# Patient Record
Sex: Male | Born: 1985 | Race: White | Hispanic: No | Marital: Single | Smoking: Current every day smoker
Health system: Southern US, Community
[De-identification: ages and names within clinical notes are randomized; demographics above are authoritative.]

## PROBLEM LIST (undated history)

## (undated) HISTORY — PX: KNEE SURGERY: SHX244

## (undated) HISTORY — PX: HAND SURGERY: SHX662

## (undated) HISTORY — PX: CYST EXCISION: SHX5701

## (undated) HISTORY — PX: WISDOM TOOTH EXTRACTION: SHX21

## (undated) HISTORY — PX: SEPTOPLASTY: SUR1290

---
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
## 2009-05-19 ENCOUNTER — Ambulatory Visit (HOSPITAL_BASED_OUTPATIENT_CLINIC_OR_DEPARTMENT_OTHER): Admission: RE | Admit: 2009-05-19 | Discharge: 2009-05-19 | Payer: Self-pay | Admitting: Orthopedic Surgery

## 2009-09-29 ENCOUNTER — Ambulatory Visit (HOSPITAL_COMMUNITY): Admission: RE | Admit: 2009-09-29 | Discharge: 2009-09-29 | Payer: Self-pay | Admitting: Otolaryngology

## 2010-03-06 ENCOUNTER — Encounter: Payer: Self-pay | Admitting: Orthopedic Surgery

## 2010-04-30 LAB — CBC
HCT: 47 % (ref 39.0–52.0)
Hemoglobin: 16.3 g/dL (ref 13.0–17.0)
MCH: 29.6 pg (ref 26.0–34.0)
MCHC: 34.7 g/dL (ref 30.0–36.0)
MCV: 85.3 fL (ref 78.0–100.0)
Platelets: 210 10*3/uL (ref 150–400)
RBC: 5.51 MIL/uL (ref 4.22–5.81)
RDW: 12 % (ref 11.5–15.5)
WBC: 7.6 10*3/uL (ref 4.0–10.5)

## 2010-04-30 LAB — URINALYSIS, ROUTINE W REFLEX MICROSCOPIC
Bilirubin Urine: NEGATIVE
Glucose, UA: NEGATIVE mg/dL
Ketones, ur: NEGATIVE mg/dL
Leukocytes, UA: NEGATIVE
Nitrite: NEGATIVE
Protein, ur: NEGATIVE mg/dL
Specific Gravity, Urine: 1.026 (ref 1.005–1.030)
Urobilinogen, UA: 1 mg/dL (ref 0.0–1.0)
pH: 6 (ref 5.0–8.0)

## 2010-04-30 LAB — SURGICAL PCR SCREEN
MRSA, PCR: NEGATIVE
Staphylococcus aureus: NEGATIVE

## 2010-04-30 LAB — URINE MICROSCOPIC-ADD ON

## 2010-05-05 LAB — POCT HEMOGLOBIN-HEMACUE: Hemoglobin: 15.8 g/dL (ref 13.0–17.0)

## 2012-09-29 ENCOUNTER — Emergency Department (HOSPITAL_COMMUNITY)
Admission: EM | Admit: 2012-09-29 | Discharge: 2012-09-29 | Disposition: A | Discharge status: Home / Self Care | Payer: BC Managed Care – PPO | Attending: Emergency Medicine | Admitting: Emergency Medicine

## 2012-09-29 ENCOUNTER — Encounter (HOSPITAL_COMMUNITY): Payer: Self-pay | Admitting: *Deleted

## 2012-09-29 DIAGNOSIS — Y929 Unspecified place or not applicable: Secondary | ICD-10-CM | POA: Insufficient documentation

## 2012-09-29 DIAGNOSIS — S339XXA Sprain of unspecified parts of lumbar spine and pelvis, initial encounter: Secondary | ICD-10-CM | POA: Insufficient documentation

## 2012-09-29 DIAGNOSIS — Y9389 Activity, other specified: Secondary | ICD-10-CM | POA: Insufficient documentation

## 2012-09-29 DIAGNOSIS — Z791 Long term (current) use of non-steroidal anti-inflammatories (NSAID): Secondary | ICD-10-CM | POA: Insufficient documentation

## 2012-09-29 DIAGNOSIS — X503XXA Overexertion from repetitive movements, initial encounter: Secondary | ICD-10-CM | POA: Insufficient documentation

## 2012-09-29 DIAGNOSIS — F17200 Nicotine dependence, unspecified, uncomplicated: Secondary | ICD-10-CM | POA: Insufficient documentation

## 2012-09-29 DIAGNOSIS — S39012A Strain of muscle, fascia and tendon of lower back, initial encounter: Secondary | ICD-10-CM

## 2012-09-29 MED ORDER — HYDROCODONE-ACETAMINOPHEN 5-325 MG PO TABS: ORAL_TABLET | ORAL | Status: DC

## 2012-09-29 NOTE — ED Provider Notes (Signed)
CSN: [id number]     Arrival date & time 09/29/12  1322 History     First MD Initiated Contact with Patient 09/29/12 1334     Chief Complaint  Patient presents with  . Back Pain   (Consider location/radiation/quality/duration/timing/severity/associated sxs/prior Treatment) HPI Comments: Patient presents with complaint of back pain. Pain started gradually yesterday after lifting 100 pound objects into his car. Pain gradually worsened during the day. He complains of weakness. Patient went to an outside urgent care and was prescribed diclofenac and Norflex which he has been taking with minimal relief. Pain continued today. Patient denies all red flag signs and symptoms of lower back pain. Course is constant. Movement makes pain worse.  Patient is a 27 y.o. male presenting with back pain. The history is provided by the patient.  Back Pain Associated symptoms: no fever, no numbness and no weakness     History reviewed. No pertinent past medical history. Past Surgical History  Procedure Laterality Date  . Knee surgery     No family history on file. History  Substance Use Topics  . Smoking status: Current Every Day Smoker -- 0.25 packs/day    Types: Cigarettes  . Smokeless tobacco: Not on file  . Alcohol Use: 16.8 oz/week    28 Cans of beer per week    Review of Systems  Constitutional: Negative for fever and unexpected weight change.  Gastrointestinal: Negative for constipation.       Neg for fecal incontinence  Genitourinary: Negative for hematuria, flank pain and difficulty urinating.       Negative for urinary incontinence or retention  Musculoskeletal: Positive for back pain.  Neurological: Negative for weakness and numbness.       Negative for saddle paresthesias     Allergies  Review of patient's allergies indicates no known allergies.  Home Medications   Current Outpatient Rx  Name  Route  Sig  Dispense  Refill  . diclofenac (VOLTAREN) 75 MG EC tablet   Oral  Take 75 mg by mouth 2 (two) times daily.         . orphenadrine (NORFLEX) 100 MG tablet   Oral   Take 100 mg by mouth 2 (two) times daily as needed for muscle spasms or pain.         Marland Kitchen HYDROcodone-acetaminophen (NORCO/VICODIN) 5-325 MG per tablet      Take 1-2 tablets every 6 hours as needed for severe pain   8 tablet   0    BP 143/83  Pulse 70  Temp(Src) 98.2 F (36.8 C) (Oral)  Resp 14  SpO2 99% Physical Exam  Nursing note and vitals reviewed. Constitutional: He appears well-developed and well-nourished.  HENT:  Head: Normocephalic and atraumatic.  Eyes: Conjunctivae are normal.  Neck: Normal range of motion.  Abdominal: Soft. There is no tenderness. There is no CVA tenderness.  Musculoskeletal: Normal range of motion.       Cervical back: He exhibits normal range of motion, no tenderness and no bony tenderness.       Thoracic back: He exhibits normal range of motion, no tenderness and no bony tenderness.       Lumbar back: He exhibits tenderness. He exhibits normal range of motion and no bony tenderness.       Back:  No step-off noted with palpation of spine.   Neurological: He is alert. He has normal reflexes. No sensory deficit. He exhibits normal muscle tone.  5/5 strength in entire lower extremities bilaterally. No sensation  deficit.   Skin: Skin is warm and dry.  Psychiatric: He has a normal mood and affect.    ED Course   Procedures (including critical care time)  Labs Reviewed - No data to display No results found. 1. Lumbosacral strain, initial encounter    2:02 PM Patient seen and examined.  Vital signs reviewed and are as follows: Filed Vitals:   09/29/12 1330  BP: 143/83  Pulse: 70  Temp: 98.2 F (36.8 C)  Resp: 14    No red flag s/s of low back pain. Patient was counseled on back pain precautions and told to do activity as tolerated but do not lift, push, or pull heavy objects more than 10 pounds for the next week.  Patient counseled to  use ice or heat on back for no longer than 15 minutes every hour.   Patient prescribed narcotic pain medicine and counseled on proper use of narcotic pain medications. Counseled not to combine this medication with others containing tylenol.   Urged patient not to drink alcohol, drive, or perform any other activities that requires focus while taking either of these medications.  Patient urged to follow-up with PCP if pain does not improve with treatment and rest or if pain becomes recurrent. Urged to return with worsening severe pain, loss of bowel or bladder control, trouble walking.   The patient verbalizes understanding and agrees with the plan.  MDM  Patient with back pain. No neurological deficits. Patient is ambulatory. No warning symptoms of back pain including: loss of bowel or bladder control, night sweats, waking from sleep with back pain, unexplained fevers or weight loss, h/o cancer, IVDU, recent trauma. No concern for cauda equina, epidural abscess, or other serious cause of back pain. Conservative measures such as rest, ice/heat and pain medicine indicated with PCP follow-up if no improvement with conservative management.    Renne Crigler, PA-C 09/29/12 1407

## 2012-09-29 NOTE — ED Notes (Signed)
PT c/o lower back pain after placing heavy objects in car.  Denies numbness/tingling.  Pt was seen at [facility] yesterday and given pain meds, but pain has increased and pt states increased pain when bearing weight on L foot.

## 2012-09-30 NOTE — ED Provider Notes (Signed)
Medical screening examination/treatment/procedure(s) were performed by non-physician practitioner and as supervising physician I was immediately available for consultation/collaboration.   Rodney Wigger, MD 09/30/12 1224 

## 2014-12-29 ENCOUNTER — Encounter (HOSPITAL_BASED_OUTPATIENT_CLINIC_OR_DEPARTMENT_OTHER): Payer: Self-pay | Admitting: Emergency Medicine

## 2014-12-29 ENCOUNTER — Emergency Department (HOSPITAL_BASED_OUTPATIENT_CLINIC_OR_DEPARTMENT_OTHER): Payer: Worker's Compensation

## 2014-12-29 DIAGNOSIS — Y9289 Other specified places as the place of occurrence of the external cause: Secondary | ICD-10-CM | POA: Diagnosis not present

## 2014-12-29 DIAGNOSIS — W208XXA Other cause of strike by thrown, projected or falling object, initial encounter: Secondary | ICD-10-CM | POA: Insufficient documentation

## 2014-12-29 DIAGNOSIS — Z9889 Other specified postprocedural states: Secondary | ICD-10-CM | POA: Diagnosis not present

## 2014-12-29 DIAGNOSIS — Y998 Other external cause status: Secondary | ICD-10-CM | POA: Diagnosis not present

## 2014-12-29 DIAGNOSIS — F17210 Nicotine dependence, cigarettes, uncomplicated: Secondary | ICD-10-CM | POA: Diagnosis not present

## 2014-12-29 DIAGNOSIS — Z791 Long term (current) use of non-steroidal anti-inflammatories (NSAID): Secondary | ICD-10-CM | POA: Insufficient documentation

## 2014-12-29 DIAGNOSIS — Y9389 Activity, other specified: Secondary | ICD-10-CM | POA: Insufficient documentation

## 2014-12-29 DIAGNOSIS — S8991XA Unspecified injury of right lower leg, initial encounter: Secondary | ICD-10-CM | POA: Diagnosis present

## 2014-12-29 NOTE — ED Notes (Signed)
Right knee pain after dropping a piece of metal on it. The patient reports that it has worsened through the day

## 2014-12-30 ENCOUNTER — Emergency Department (HOSPITAL_BASED_OUTPATIENT_CLINIC_OR_DEPARTMENT_OTHER)
Admission: EM | Admit: 2014-12-30 | Discharge: 2014-12-30 | Disposition: A | Discharge status: Home / Self Care | Payer: Worker's Compensation | Attending: Emergency Medicine | Admitting: Emergency Medicine

## 2014-12-30 DIAGNOSIS — S8991XA Unspecified injury of right lower leg, initial encounter: Secondary | ICD-10-CM

## 2014-12-30 NOTE — Discharge Instructions (Signed)
Keep leg elevated to help control swelling. Call and make an appointment to follow-up with the orthopedist. Return immediately for worsening pain or for any concerns.    Knee Immobilizer A knee immobilizer is used to support and protect an injured or painful knee. Knee immobilizers keep your knee from being used while it is healing. Some of the common immobilizers used include splints (air, plaster, fiberglass, stiff cloth, or aluminum) or casts. Wear your knee immobilizer as instructed and only remove it as instructed. HOME CARE INSTRUCTIONS   Use absorbent powder (such as baby powder or talcum powder) to control irritation from sweat and friction.  Adjust the immobilizer to be firm but not tight. Signs of an immobilizer that is too tight include:  Swelling.  Numbness.  Color change in your foot or ankle.  Increased pain.  While resting, raise your leg above the level of your heart. Pillows can be used for support. This reduces throbbing and helps healing.  Remove the immobilizer to bathe and sleep. SEEK MEDICAL CARE IF:   You have increasing pain or swelling in the knee, foot, or ankle.  You have problems caused by the knee immobilizer, or it breaks or needs replacement. MAKE SURE YOU:   Understand these instructions.  Will watch your condition.  Will get help right away if you are not doing well or get worse.   This information is not intended to replace advice given to you by your health care provider. Make sure you discuss any questions you have with your health care provider.   Document Released: 01/31/2005 Document Revised: 02/21/2014 Document Reviewed: 09/24/2012 Elsevier Interactive Patient Education [employer]2016 [employer].

## 2014-12-30 NOTE — ED Provider Notes (Signed)
CSN: [id number]     Arrival date & time 12/29/14  2228 History  By signing my name below, I, Budd Palmer, attest that this documentation has been prepared under the direction and in the presence of Loren Racer, MD. Electronically Signed: Budd Palmer, ED Scribe. 12/30/2014. 1:04 AM.    Chief Complaint  Patient presents with  . Knee Pain   The history is provided by the patient and a parent. No language interpreter was used.   HPI Comments: Bradley Brady is a 29 y.o. male smoker at 0.25 ppd with a PSHx of left knee surgery who presents to the Emergency Department complaining of constant, aching, worsening right knee pain onset. Pt states he dropped a heavy piece of sheet metal on top of left knee 11 hours ago. He states he did not pay much attention to it and was sitting at a desk for a while. He notes the pain then began to worsen progressively. He reports associated popping and swelling of the joint. Mom states pt has been walking on tiptoe on the right foot as the pain worsened. He notes he has tried taking tylenol with no relief. He states he has also been keeping it elevated and has applied ice once. He reports exacerbation of the pain with walking and straightening the knee. He denies any focal weakness or numbness.   History reviewed. No pertinent past medical history. Past Surgical History  Procedure Laterality Date  . Knee surgery     History reviewed. No pertinent family history. Social History  Substance Use Topics  . Smoking status: Current Every Day Smoker -- 0.25 packs/day    Types: Cigarettes  . Smokeless tobacco: None  . Alcohol Use: 16.8 oz/week    28 Cans of beer per week    Review of Systems  Musculoskeletal: Positive for joint swelling, arthralgias and gait problem.  Neurological: Negative for weakness and numbness.  All other systems reviewed and are negative.   Allergies  Review of patient's allergies indicates no known allergies.  Home  Medications   Prior to Admission medications   Medication Sig Start Date End Date Taking? Authorizing Provider  diclofenac (VOLTAREN) 75 MG EC tablet Take 75 mg by mouth 2 (two) times daily.    Historical Provider, MD  HYDROcodone-acetaminophen (NORCO/VICODIN) 5-325 MG per tablet Take 1-2 tablets every 6 hours as needed for severe pain 09/29/12   Renne Crigler, PA-C  orphenadrine (NORFLEX) 100 MG tablet Take 100 mg by mouth 2 (two) times daily as needed for muscle spasms or pain.    Historical Provider, MD   BP 137/74 mmHg  Pulse 87  Temp(Src) 99.1 F (37.3 C) (Oral)  Resp 16  Ht  (1.676 m)  Wt 180 lb (81.647 kg)  BMI 29.07 kg/m2  SpO2 100% Physical Exam  Constitutional: He is oriented to person, place, and time. He appears well-developed and well-nourished. No distress.  HENT:  Head: Normocephalic and atraumatic.  Mouth/Throat: Oropharynx is clear and moist.  Eyes: EOM are normal. Pupils are equal, round, and reactive to light.  Neck: Normal range of motion. Neck supple.  Cardiovascular: Normal rate and regular rhythm.   Pulmonary/Chest: Effort normal and breath sounds normal. No respiratory distress. He has no wheezes. He has no rales.  Abdominal: Soft. Bowel sounds are normal.  Musculoskeletal: Normal range of motion. He exhibits tenderness. He exhibits no edema.  Patient with mild tenderness just superior to the right patella. There is a small area of erythema. There is  no patellar tenderness. Small amount of suprapatellar effusion. No ligamentous instability. Full range of motion of the right knee without pain. Distal pulses intact.  Neurological: He is alert and oriented to person, place, and time.  Patient with 5/5 flexion and extension of the right knee. Sensation to the right leg intact. Ambulating without difficulty or obvious pain.  Skin: Skin is warm and dry. No rash noted. No erythema.  Psychiatric: He has a normal mood and affect. His behavior is normal.  Nursing  note and vitals reviewed.   ED Course  Procedures  DIAGNOSTIC STUDIES: Oxygen Saturation is 100% on RA, normal by my interpretation.    COORDINATION OF CARE: 1:02 AM - Discussed XR results. Discussed plans to order a knee immobilizer. Will refer to an orthopedist for re-evaluation. Pt advised of plan for treatment and pt agrees.  Labs Review Labs Reviewed - No data to display  Imaging Review Dg Knee Complete 4 Views Right  12/29/2014  CLINICAL DATA:  Trauma to the anterior knee 11 hours ago, struck by a piece of metal. Worsening pain. EXAM: RIGHT KNEE - COMPLETE 4+ VIEW COMPARISON:  None. FINDINGS: There is in knee joint effusion. Question of osteochondral fracture of the femoral condylar region. These findings can be seen with ACL tear. Was the mechanism of injury consistent with that? No other definite fracture. IMPRESSION: Joint effusion. Question femoral condylar osteochondral fracture, as may be seen in the setting of an ACL tear. Is the mechanism of injury consistent with that? Electronically Signed   By: Paulina FusiMark  Shogry M.D.   On: 12/29/2014 23:22   I have personally reviewed and evaluated these images and lab results as part of my medical decision-making.   EKG Interpretation None      MDM   Final diagnoses:  Knee injury, right, initial encounter    I personally performed the services described in this documentation, which was scribed in my presence. The recorded information has been reviewed and is accurate.   Questionable condylar osteochondral fracture read by radiologist. This does not appear to be consistent with the mechanism of injury. No definite ligamentous instability. Again not consistent with the mechanism of injury. We'll place in a knee immobilizer and have follow-up with orthopedics for repeat imaging and MRI as necessary. Return precautions given.  Loren Raceravid Jermany Rimel, MD 12/30/14 [phone]

## 2014-12-30 NOTE — ED Notes (Signed)
MD at bedside. 

## 2016-05-31 ENCOUNTER — Emergency Department (HOSPITAL_BASED_OUTPATIENT_CLINIC_OR_DEPARTMENT_OTHER)
Admission: EM | Admit: 2016-05-31 | Discharge: 2016-05-31 | Disposition: A | Discharge status: Home / Self Care | Payer: Worker's Compensation | Attending: Emergency Medicine | Admitting: Emergency Medicine

## 2016-05-31 ENCOUNTER — Encounter (HOSPITAL_BASED_OUTPATIENT_CLINIC_OR_DEPARTMENT_OTHER): Payer: Self-pay

## 2016-05-31 DIAGNOSIS — Z87891 Personal history of nicotine dependence: Secondary | ICD-10-CM | POA: Insufficient documentation

## 2016-05-31 DIAGNOSIS — X500XXA Overexertion from strenuous movement or load, initial encounter: Secondary | ICD-10-CM | POA: Diagnosis not present

## 2016-05-31 DIAGNOSIS — S39012A Strain of muscle, fascia and tendon of lower back, initial encounter: Secondary | ICD-10-CM | POA: Diagnosis not present

## 2016-05-31 DIAGNOSIS — Y93F2 Activity, caregiving, lifting: Secondary | ICD-10-CM | POA: Insufficient documentation

## 2016-05-31 DIAGNOSIS — Y990 Civilian activity done for income or pay: Secondary | ICD-10-CM | POA: Insufficient documentation

## 2016-05-31 DIAGNOSIS — Y929 Unspecified place or not applicable: Secondary | ICD-10-CM | POA: Insufficient documentation

## 2016-05-31 DIAGNOSIS — S3992XA Unspecified injury of lower back, initial encounter: Secondary | ICD-10-CM | POA: Diagnosis present

## 2016-05-31 MED ORDER — IBUPROFEN 600 MG PO TABS: 600.0000 mg | ORAL_TABLET | Freq: Three times a day (TID) | ORAL | 0 refills | Status: DC | PRN

## 2016-05-31 MED ORDER — HYDROCODONE-ACETAMINOPHEN 5-325 MG PO TABS: 1.0000 | ORAL_TABLET | Freq: Four times a day (QID) | ORAL | 0 refills | Status: DC | PRN

## 2016-05-31 MED ORDER — CYCLOBENZAPRINE HCL 10 MG PO TABS: 10.0000 mg | ORAL_TABLET | Freq: Two times a day (BID) | ORAL | 0 refills | Status: DC | PRN

## 2016-05-31 MED ORDER — HYDROCODONE-ACETAMINOPHEN 5-325 MG PO TABS
1.0000 | ORAL_TABLET | Freq: Once | ORAL | Status: AC
  Administered 2016-05-31: 1 via ORAL
  Filled 2016-05-31: qty 1

## 2016-05-31 MED ORDER — SODIUM CHLORIDE 0.9 % IV BOLUS (SEPSIS)
250.0000 mL | Freq: Once | INTRAVENOUS | Status: AC
  Administered 2016-05-31: 250 mL via INTRAVENOUS

## 2016-05-31 MED ORDER — KETOROLAC TROMETHAMINE 60 MG/2ML IM SOLN
30.0000 mg | Freq: Once | INTRAMUSCULAR | Status: AC
  Administered 2016-05-31: 30 mg via INTRAMUSCULAR
  Filled 2016-05-31: qty 2

## 2016-05-31 MED ORDER — ONDANSETRON HCL 4 MG/2ML IJ SOLN
4.0000 mg | Freq: Once | INTRAMUSCULAR | Status: AC
  Administered 2016-05-31: 4 mg via INTRAVENOUS
  Filled 2016-05-31: qty 2

## 2016-05-31 MED ORDER — HYDROMORPHONE HCL 1 MG/ML IJ SOLN
1.0000 mg | Freq: Once | INTRAMUSCULAR | Status: AC
  Administered 2016-05-31: 1 mg via INTRAVENOUS
  Filled 2016-05-31: qty 1

## 2016-05-31 MED ORDER — SODIUM CHLORIDE 0.9 % IV SOLN
INTRAVENOUS | Status: DC
Start: 2016-06-01 — End: 2016-05-31

## 2016-05-31 MED ORDER — CYCLOBENZAPRINE HCL 10 MG PO TABS
10.0000 mg | ORAL_TABLET | Freq: Once | ORAL | Status: AC
  Administered 2016-05-31: 10 mg via ORAL
  Filled 2016-05-31: qty 1

## 2016-05-31 NOTE — Discharge Instructions (Signed)
Take the pain medications as directed. Rest is much as possible. Work note provided. Return for any new or worse symptoms or  Not improving over the next 3 days.

## 2016-05-31 NOTE — ED Notes (Signed)
Pt. attempted to ambulate per physician's request. Pt stood to bedside with assistance, took one step, refused to take another. Stated "I'll try again later."

## 2016-05-31 NOTE — ED Notes (Signed)
Pt unable to ambulate in the hallway due to pain. MD aware

## 2016-05-31 NOTE — ED Triage Notes (Addendum)
Pt was able to get out of front seat of truck slowly with no needed hands on assist-stand by assist with w/c was provided-pt brought into ED WT via w/c

## 2016-05-31 NOTE — ED Notes (Signed)
ED Provider at bedside. 

## 2016-05-31 NOTE — ED Triage Notes (Signed)
Pt c/o sudden onset lower back pain while leaning over table at work approx 10am-hx of same approx 4 years ago-presents to triage in w/c with father

## 2016-05-31 NOTE — ED Provider Notes (Addendum)
The patient required additional 1 mg of Dilaudid IV pain now much better able to stand and moving okay. Will be discharged home. He also requested Flexeril so additional prescription given for that. Work note provided  On my exam patient had no significant neuro focal deficits at all. No concerns for cauda equina syndrome or sciatica.   Vanetta Mulders, MD 05/31/16 1857   The patient scripts were never provided to him from the pharmacy here. They were taken over to be filled. She scripts had to be rewritten. This does give him duplicate narcotic prescription. Charge nurse stated that she will notify the pharmacy so that they do not in addition fill the narcotic prescription from here.   Vanetta Mulders, MD 05/31/16 [phone]

## 2016-05-31 NOTE — ED Provider Notes (Signed)
[facility] Provider [facility]   CSN: [id number] Arrival date & time: 05/31/16  1341     History   Chief Complaint Chief Complaint  Patient presents with  . Back Pain    HPI Bradley Brady is a 31 y.o. male.  The history is provided by the patient.  Back Pain   This is a new problem. The current episode started 3 to 5 hours ago. The problem occurs constantly. The problem has been gradually worsening. The pain is associated with lifting heavy objects. The pain is present in the lumbar spine. The quality of the pain is described as aching. The pain does not radiate. The pain is severe. The symptoms are aggravated by certain positions. Pertinent negatives include no fever, no abdominal pain, no bowel incontinence, no bladder incontinence, no dysuria and no weakness. Treatments tried: rest. The treatment provided no relief.   Pt reports recent heavy lifting over past several weeks Earlier today while bending over a table he had onset of low back pain He reports due to pain it is difficult to walk but no weakness He had otherwise been well No recent illnesses   PMH - none Past Surgical History:  Procedure Laterality Date  . KNEE SURGERY    . KNEE SURGERY         Home Medications    Prior to Admission medications   Not on File    Family History No family history on file.  Social History Social History  Substance Use Topics  . Smoking status: Former Smoker    Packs/day: 0.25  . Smokeless tobacco: Never Used  . Alcohol use Yes     Comment: daily     Allergies   Patient has no known allergies.   Review of Systems Review of Systems  Constitutional: Negative for fever.  Gastrointestinal: Negative for abdominal pain and bowel incontinence.  Genitourinary: Negative for bladder incontinence and dysuria.  Musculoskeletal: Positive for back pain.  Neurological: Negative for weakness.  All other systems reviewed and are negative.    Physical  Exam Updated Vital Signs BP 124/87 (BP Location: Right Arm)   Pulse 74   Temp 98.5 F (36.9 C) (Oral)   Resp 18   Ht  (1.676 m)   Wt 81.6 kg   SpO2 100%   BMI 29.05 kg/m   Physical Exam CONSTITUTIONAL: Well developed/well nourished HEAD: Normocephalic/atraumatic EYES: EOMI/PERRL ENMT: Mucous membranes moist NECK: supple no meningeal signs SPINE/BACK:lumbar spinal and paraspinal tenderness, No bruising/crepitance/stepoffs noted to spine CV: S1/S2 noted, no murmurs/rubs/gallops noted LUNGS: Lungs are clear to auscultation bilaterally, no apparent distress ABDOMEN: soft, nontender, no rebound or guarding GU:no cva tenderness NEURO: Awake/alert,equal motor 5/5 strength noted with the following: hip flexion/knee flexion/extension, foot dorsi/plantar flexion, great toe extension intact bilaterally, no clonus bilaterally, plantar reflex appropriate (toes downgoing), no sensory deficit in any dermatome.   EXTREMITIES: pulses normal, full ROM SKIN: warm, color normal PSYCH: no abnormalities of mood noted, alert and oriented to situation    ED Treatments / Results  Labs (all labs ordered are listed, but only abnormal results are displayed) Labs Reviewed - No data to display  EKG  EKG Interpretation None       Radiology No results found.  Procedures Procedures (including critical care time)  Medications Ordered in ED Medications  ketorolac (TORADOL) injection 30 mg (30 mg Intramuscular Given 05/31/16 1439)  cyclobenzaprine (FLEXERIL) tablet 10 mg (10 mg Oral Given 05/31/16 1441)  HYDROcodone-acetaminophen (NORCO/VICODIN) 5-325 MG  per tablet 1 tablet (1 tablet Oral Given 05/31/16 1536)     Initial Impression / Assessment and Plan / ED Course  I have reviewed the triage vital signs and the nursing notes.   2:51 PM Will treat pain and reassess Narcotic database reviewed and considered in decision making 3:46 PM Pt with continued pain vicodin ordered signout to  dr Deretha Emory Recheck in 15-20 minutes, if able to ambulate he may be discharged I have already discussed strict return instructions with patient/family   Final Clinical Impressions(s) / ED Diagnoses   Final diagnoses:  Lumbosacral strain, initial encounter    New Prescriptions New Prescriptions   HYDROCODONE-ACETAMINOPHEN (NORCO/VICODIN) 5-325 MG TABLET    Take 1 tablet by mouth every 6 (six) hours as needed for severe pain.   IBUPROFEN (ADVIL,MOTRIN) 600 MG TABLET    Take 1 tablet (600 mg total) by mouth every 8 (eight) hours as needed for moderate pain.     Zadie Rhine, MD 05/31/16 [phone]

## 2017-03-16 IMAGING — DX DG KNEE COMPLETE 4+V*R*
5 series · 5 of 5 positions shown · non-contrast
Comparison: None.

CLINICAL DATA: Trauma to the anterior knee 11 hours ago, struck by
a piece of metal. Worsening pain.

EXAM:
RIGHT KNEE - COMPLETE 4+ VIEW

[knee ap]
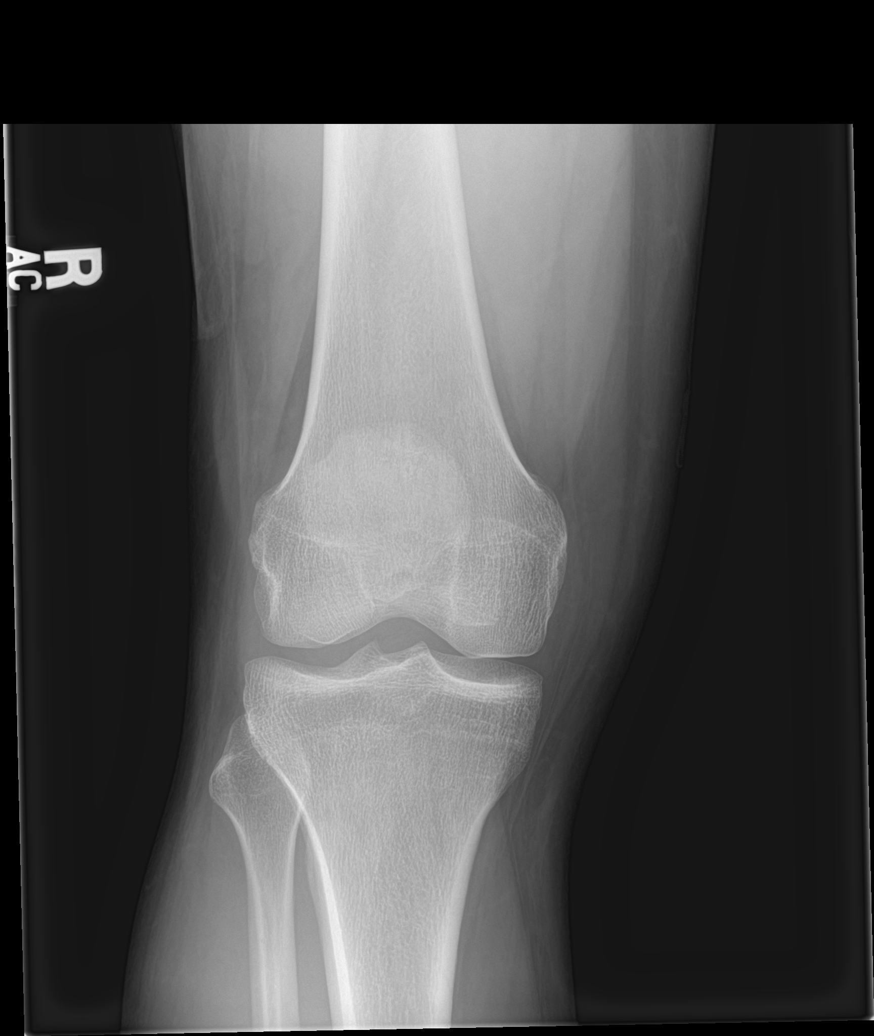

[tunnel]
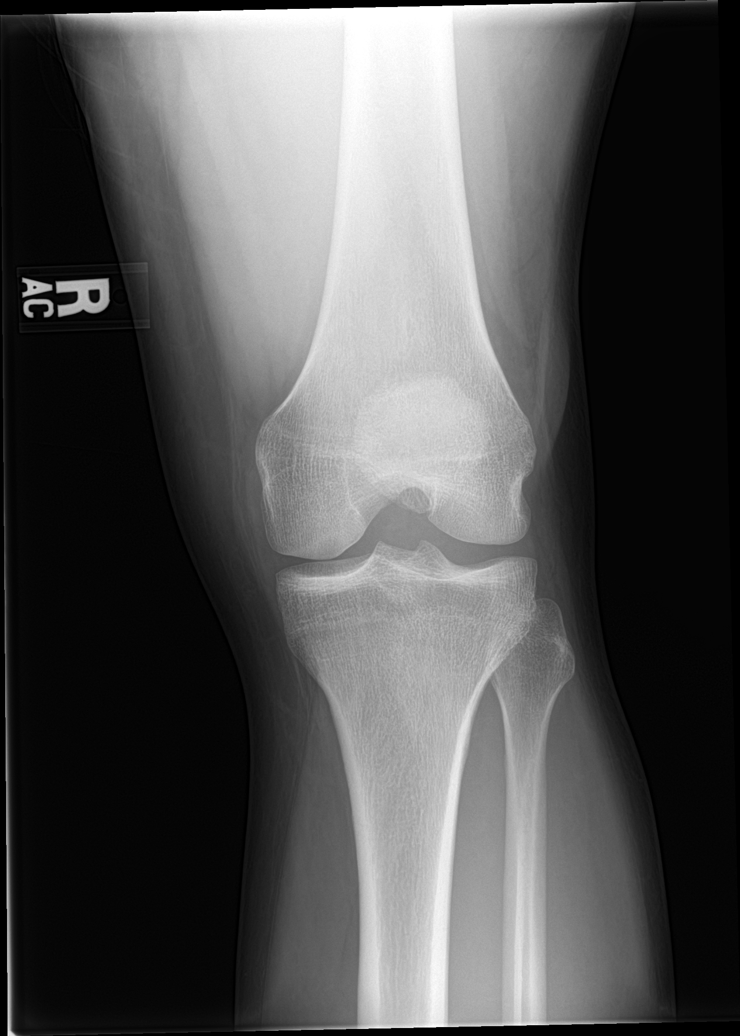

[knee lat]
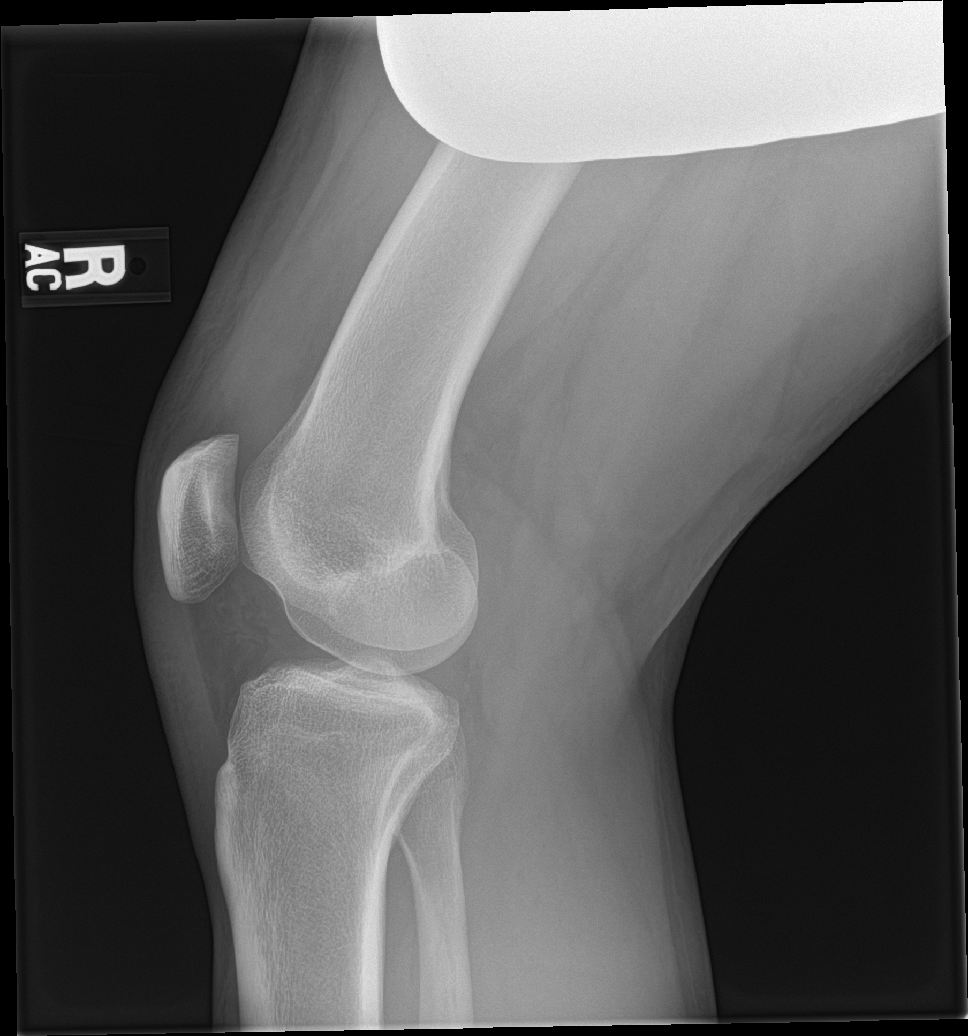

[knee sunrise (1 of 2)]
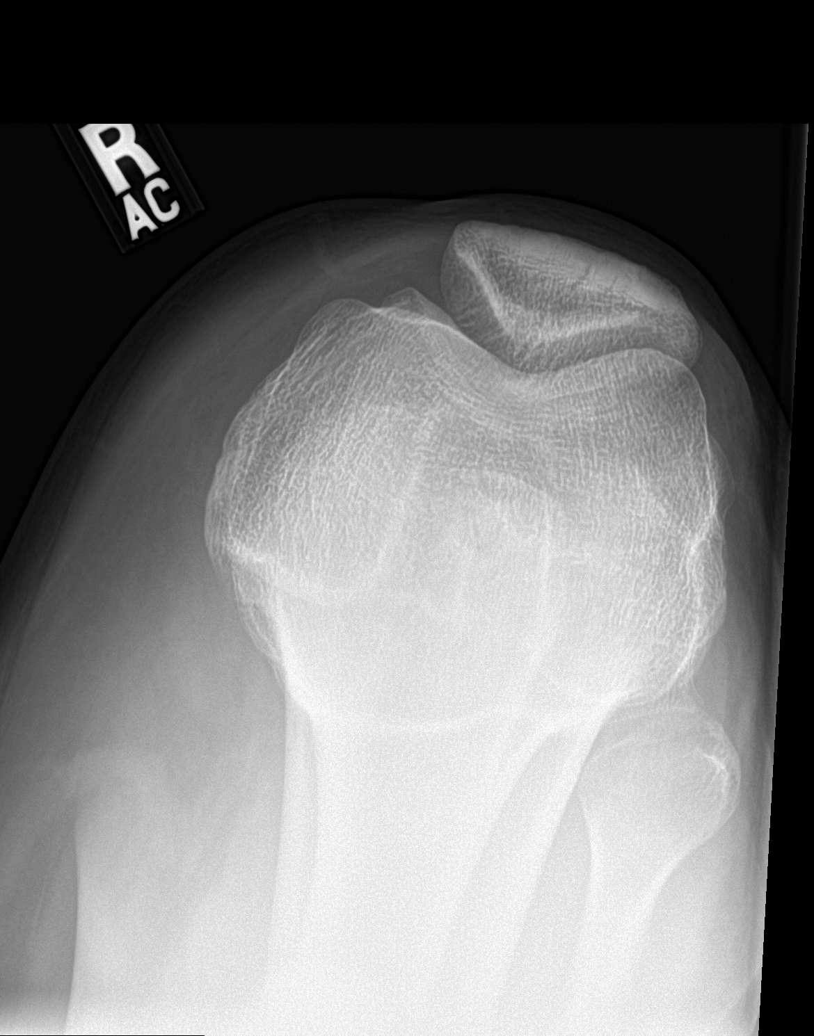

[knee sunrise (2 of 2)]
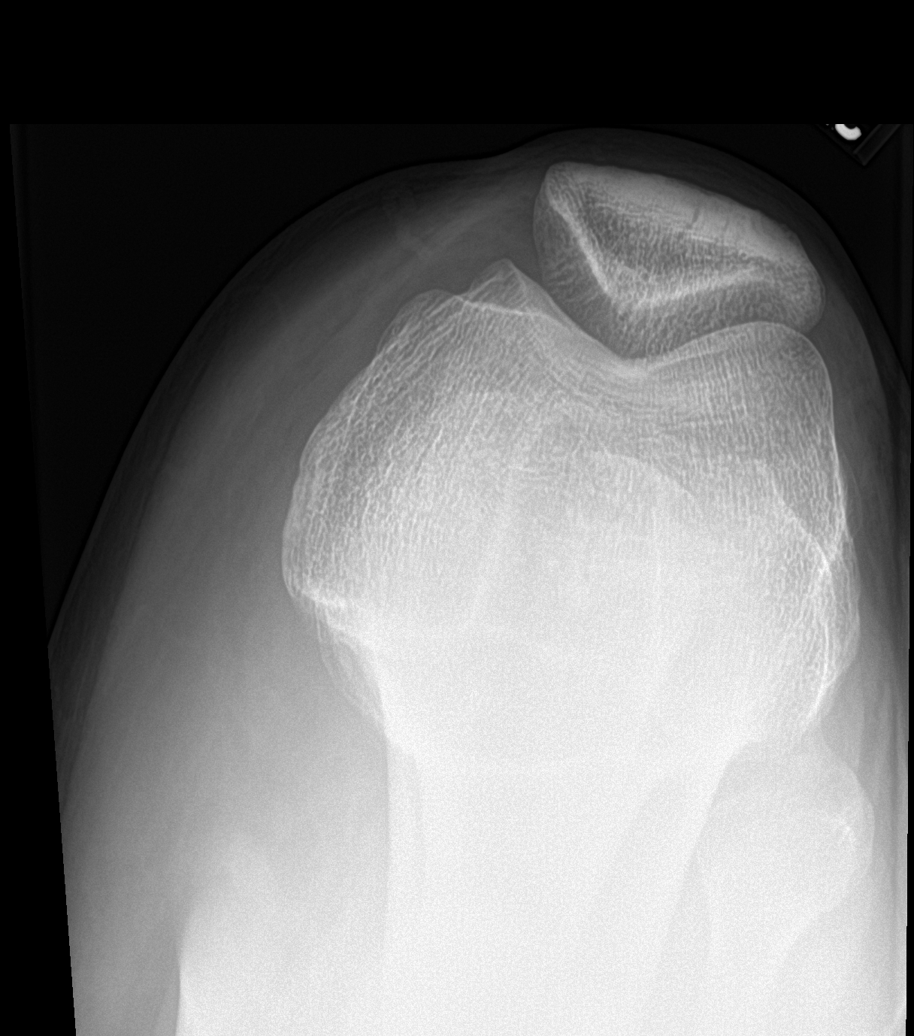

[5 of 5 positions shown; findings below may reference images not displayed]

FINDINGS: There is in knee joint effusion. Question of osteochondral fracture
of the femoral condylar region. These findings can be seen with ACL
tear. Was the mechanism of injury consistent with that? No other
definite fracture.
IMPRESSION: Joint effusion.

Question femoral condylar osteochondral fracture, as may be seen in
the setting of an ACL tear. Is the mechanism of injury consistent
with that?

## 2021-08-24 ENCOUNTER — Encounter: Payer: Self-pay | Admitting: Neurology

## 2021-08-24 ENCOUNTER — Ambulatory Visit: Payer: Managed Care, Other (non HMO) | Admitting: Neurology

## 2021-08-24 VITALS — BP 138/83 | HR 85 | Ht 66.0 in | Wt 175.8 lb

## 2021-08-24 DIAGNOSIS — F411 Generalized anxiety disorder: Secondary | ICD-10-CM | POA: Diagnosis not present

## 2021-08-24 DIAGNOSIS — R4182 Altered mental status, unspecified: Secondary | ICD-10-CM | POA: Diagnosis not present

## 2021-08-24 MED ORDER — CLONAZEPAM 0.5 MG PO TABS: 0.5000 mg | ORAL_TABLET | Freq: Two times a day (BID) | ORAL | 0 refills | Status: AC | PRN

## 2021-08-24 MED ORDER — CITALOPRAM HYDROBROMIDE 20 MG PO TABS: 20.0000 mg | ORAL_TABLET | Freq: Every day | ORAL | 11 refills | Status: AC

## 2021-08-24 NOTE — Patient Instructions (Signed)
Start with Klonopin 0.5 twice daily for one week  Start with Celexa 20 mg (1/2 tablet) daily for one week then increase to full tab thereafter  Follow up in 6 months

## 2021-08-24 NOTE — Progress Notes (Signed)
[facility]  PATIENT: Bradley Brady DOB: Dec 05, 1985  REQUESTING CLINICIAN: Laurence Spates, MD HISTORY FROM: Patient  REASON FOR VISIT: Abnormal sensorium   HISTORICAL  CHIEF COMPLAINT:  Chief Complaint  Patient presents with   New Patient (Initial Visit)    Room 18 - alone. Intermittent episodes of tingling that typically starts in the eyes then spreads to his face and bilateral hands. These episodes are also associated with feeling off balance, as if the "floor is dropping our from under me." Sometimes wakes up w/ numbness in hands. Occurs randomly, without warning. May happen 2-3 times per month, lasting up to one hours. Symptoms started in 2019.     HISTORY OF PRESENT ILLNESS:  This is a 36 year old gentleman with no reported past medical history who is presenting with complaint of abnormal sensorium.  Patient reports the episodes started in 2019 at that time he was experiencing left arm pain, trouble breathing feel like his head was hot on fire.  He reports these episodes lasted almost 2 years when he was experiencing the symptoms daily.  During this time also he was experiencing memory loss.  It was very difficult for him to complete simple task.   Now he is experiencing episodes of losing control of his body feeling like he is going to collapse sometimes he feels like the floor drops out under him.  He denies any abnormal movement or abnormal behavior.  He does use alcohol and marijuana.   He was never diagnosed with an anxiety disorder but reports that in the past as a child he has a diagnosis of ADHD and report episode of anxiety that he described as numbness in both arms sometimes feel like he cannot breathe if.    OTHER MEDICAL CONDITIONS: Denies    REVIEW OF SYSTEMS: Full 14 system review of systems performed and negative with exception of: as noted in the HPI   ALLERGIES: No Known Allergies  HOME MEDICATIONS: Outpatient Medications Prior to  Visit  Medication Sig Dispense Refill   cyclobenzaprine (FLEXERIL) 10 MG tablet Take 1 tablet (10 mg total) by mouth 2 (two) times daily as needed for muscle spasms. 20 tablet 0   HYDROcodone-acetaminophen (NORCO/VICODIN) 5-325 MG tablet Take 1 tablet by mouth every 6 (six) hours as needed for severe pain. 10 tablet 0   ibuprofen (ADVIL,MOTRIN) 600 MG tablet Take 1 tablet (600 mg total) by mouth every 8 (eight) hours as needed for moderate pain. 30 tablet 0   No facility-administered medications prior to visit.    PAST MEDICAL HISTORY: History reviewed. No pertinent past medical history.  PAST SURGICAL HISTORY: Past Surgical History:  Procedure Laterality Date   CYST EXCISION     benign from head   HAND SURGERY Left    KNEE SURGERY Left    SEPTOPLASTY     WISDOM TOOTH EXTRACTION      FAMILY HISTORY: Family History  Problem Relation Age of Onset   Other Mother        unsure of medical history   Other Father        unsure of medical history    SOCIAL HISTORY: Social History   Socioeconomic History   Marital status: Single    Spouse name: Not on file   Number of children: 0   Years of education: college   Highest education level: Associate degree: academic program  Occupational History   Occupation: Therapist, music  Tobacco Use   Smoking status: Every Day  Packs/day: 0.50    Types: Cigarettes   Smokeless tobacco: Never  Substance and Sexual Activity   Alcohol use: Yes    Comment: 6 beers per day   Drug use: Yes    Types: Marijuana    Comment: rare marijuana, tried "shrooms" once 2023 (became violent)   Sexual activity: Not on file  Other Topics Concern   Not on file  Social History Narrative   Right-handed.   Rare caffeine use.   Lives alone.    Social Determinants of Health   Financial Resource Strain: Not on file  Food Insecurity: Not on file  Transportation Needs: Not on file  Physical Activity: Not on file  Stress: Not on file  Social  Connections: Not on file  Intimate Partner Violence: Not on file    PHYSICAL EXAM  GENERAL EXAM/CONSTITUTIONAL: Vitals:  Vitals:   08/24/21 1459  BP: 138/83  Pulse: 85  Weight: 175 lb 12.8 oz (79.7 kg)  Height: 5\' 6"  (1.676 m)   Body mass index is 28.37 kg/m. Wt Readings from Last 3 Encounters:  08/24/21 175 lb 12.8 oz (79.7 kg)  05/31/16 180 lb (81.6 kg)  12/29/14 180 lb (81.6 kg)   Patient is in no distress; well developed, nourished and groomed; neck is supple, appears somewhat restless   EYES: Pupils round and reactive to light, Visual fields full to confrontation, Extraocular movements intacts,   MUSCULOSKELETAL: Gait, strength, tone, movements noted in Neurologic exam below  NEUROLOGIC: MENTAL STATUS:      No data to display         awake, alert, oriented to person, place and time recent and remote memory intact normal attention and concentration language fluent, comprehension intact, naming intact fund of knowledge appropriate  CRANIAL NERVE:  2nd, 3rd, 4th, 6th - pupils equal and reactive to light, visual fields full to confrontation, extraocular muscles intact, no nystagmus 5th - facial sensation symmetric 7th - facial strength symmetric 8th - hearing intact 9th - palate elevates symmetrically, uvula midline 11th - shoulder shrug symmetric 12th - tongue protrusion midline  MOTOR:  normal bulk and tone, full strength in the BUE, BLE  SENSORY:  normal and symmetric to light touch, pinprick, temperature, vibration  COORDINATION:  finger-nose-finger, fine finger movements normal  REFLEXES:  deep tendon reflexes present and symmetric  GAIT/STATION:  normal   DIAGNOSTIC DATA (LABS, IMAGING, TESTING) - I reviewed patient records, labs, notes, testing and imaging myself where available.  Lab Results  Component Value Date   WBC 7.6 09/28/2009   HGB 16.3 09/28/2009   HCT 47.0 09/28/2009   MCV 85.3 09/28/2009   PLT 210 09/28/2009   No  results found for: "NA", "K", "CL", "CO2", "GLUCOSE", "BUN", "CREATININE", "CALCIUM", "PROT", "ALBUMIN", "AST", "ALT", "ALKPHOS", "BILITOT", "GFRNONAA", "GFRAA" No results found for: "CHOL", "HDL", "LDLCALC", "LDLDIRECT", "TRIG", "CHOLHDL" No results found for: "HGBA1C" No results found for: "VITAMINB12" No results found for: "TSH"    ASSESSMENT AND PLAN  36 y.o. year old male with no reported medical history who is presenting with multiple complaints including what he described like head on fire, difficulty with remembering, trouble with concentration and trouble with completing task.  There is also report of tingling sensation in the arms and chest tightness with inability to breathe.  On exam there were no focality except that he appears restless.  I have informed patient that the symptoms more likely related to anxiety and that I will like to start him on Celexa but prior I  will give him a bridge of Klonopin for about a week.  Due to his complaint of altered sensorium, I will also obtain a routine EEG to rule out seizures.  He is comfortable with plan.  I will contact him to go over the result and if normal he can continue to follow with his PCP and return in 6 months.  He voices understanding.   1. Altered mental status, unspecified altered mental status type   2. Generalized anxiety disorder      Patient Instructions  Start with Klonopin 0.5 twice daily for one week  Start with Celexa 20 mg (1/2 tablet) daily for one week then increase to full tab thereafter  Follow up in 6 months   Orders Placed This Encounter  Procedures   EEG adult    Meds ordered this encounter  Medications   clonazePAM (KLONOPIN) 0.5 MG tablet    Sig: Take 1 tablet (0.5 mg total) by mouth 2 (two) times daily as needed for up to 7 days for anxiety.    Dispense:  14 tablet    Refill:  0   citalopram (CELEXA) 20 MG tablet    Sig: Take 1 tablet (20 mg total) by mouth daily.    Dispense:  30 tablet     Refill:  11    Return in about 6 months (around 02/24/2022).  I have spent a total of 45 minutes dedicated to this patient today, preparing to see patient, performing a medically appropriate examination and evaluation, ordering tests and/or medications and procedures, and counseling and educating the patient/family/caregiver; independently interpreting result and communicating results to the family/patient/caregiver; and documenting clinical information in the electronic medical record.   Alric Ran, MD 08/24/2021, 6:43 PM  [facility] [address], [place], [place] [address] [phone]

## 2021-09-08 ENCOUNTER — Ambulatory Visit: Payer: Managed Care, Other (non HMO) | Admitting: Neurology

## 2021-09-08 DIAGNOSIS — R4182 Altered mental status, unspecified: Secondary | ICD-10-CM | POA: Diagnosis not present

## 2021-09-08 NOTE — Procedures (Signed)
    History:  36 year old man with altered sensorium   EEG classification:  Awake and asleep  Description of the recording: The background rhythms of this recording consists of a fairly well modulated medium amplitude background activity of 12 Hz. As the record progresses, the patient initially is in the waking state, but appears to enter the early stage II sleep during the recording, with rudimentary sleep spindles and vertex sharp wave activity seen. During the wakeful state, photic stimulation is performed, and no abnormal responses were seen. Hyperventilation was also performed, no abnormal response seen. No epileptiform discharges seen during this recording. There was no focal slowing. EKG monitor shows no evidence of cardiac rhythm abnormalities with a heart rate of 78.  Abnormality: None   Impression: This is a normal EEG recording in the waking and sleeping state. No evidence interictal epileptiform discharges were seen at any time during the recording.  A normal EEG does not exclude a diagnosis of epilepsy.    Bradley Norfolk, MD [facility]

## 2021-09-09 ENCOUNTER — Telehealth: Payer: Self-pay

## 2021-09-09 NOTE — Telephone Encounter (Signed)
Pt verified by name and DOB,  normal results given per provider, pt voiced understanding all question answered. °

## 2021-09-09 NOTE — Telephone Encounter (Signed)
-----   Message from Windell Norfolk, MD sent at 09/09/2021  8:17 AM EDT ----- Please call and inform patient that his recent EEG (Brain wave test) was normal. In particular, there were no epileptiform discharges and no seizures. No further action is required on this test at this time. Please keep any upcoming appointments or tests and  call us with any interim questions, concerns, problems or updates. Thanks,   Windell Norfolk, MD

## 2021-09-09 NOTE — Progress Notes (Signed)
Please call and inform patient that his recent EEG (Brain wave test) was normal. In particular, there were no epileptiform discharges and no seizures. No further action is required on this test at this time. Please keep any upcoming appointments or tests and  call us with any interim questions, concerns, problems or updates. Thanks,   Emmie Frakes, MD 

## 2022-03-03 ENCOUNTER — Ambulatory Visit: Payer: Managed Care, Other (non HMO) | Admitting: Neurology
